# Patient Record
Sex: Male | Born: 1975 | Race: Black or African American | Hispanic: No | Marital: Married | Smoking: Never smoker
Health system: Southern US, Community
[De-identification: ages and names within clinical notes are randomized; demographics above are authoritative.]

## PROBLEM LIST (undated history)

## (undated) DIAGNOSIS — T7840XA Allergy, unspecified, initial encounter: Secondary | ICD-10-CM

## (undated) DIAGNOSIS — C801 Malignant (primary) neoplasm, unspecified: Secondary | ICD-10-CM

## (undated) HISTORY — PX: COLON SURGERY: SHX602

---

## 2009-02-17 ENCOUNTER — Ambulatory Visit: Payer: Self-pay | Admitting: Internal Medicine

## 2011-04-06 ENCOUNTER — Ambulatory Visit: Payer: Self-pay | Admitting: Internal Medicine

## 2011-11-05 ENCOUNTER — Ambulatory Visit: Payer: Self-pay | Admitting: Family Medicine

## 2012-10-28 IMAGING — CR DG CHEST 2V
1 series · 3 of 3 positions shown · non-contrast
Comparison: none

REASON FOR EXAM: palpitations, chest discomfort
COMMENTS:

[Series 1: view not recorded · 0.17mm/px · 3 of 3 slices shown]
[im 1/3]
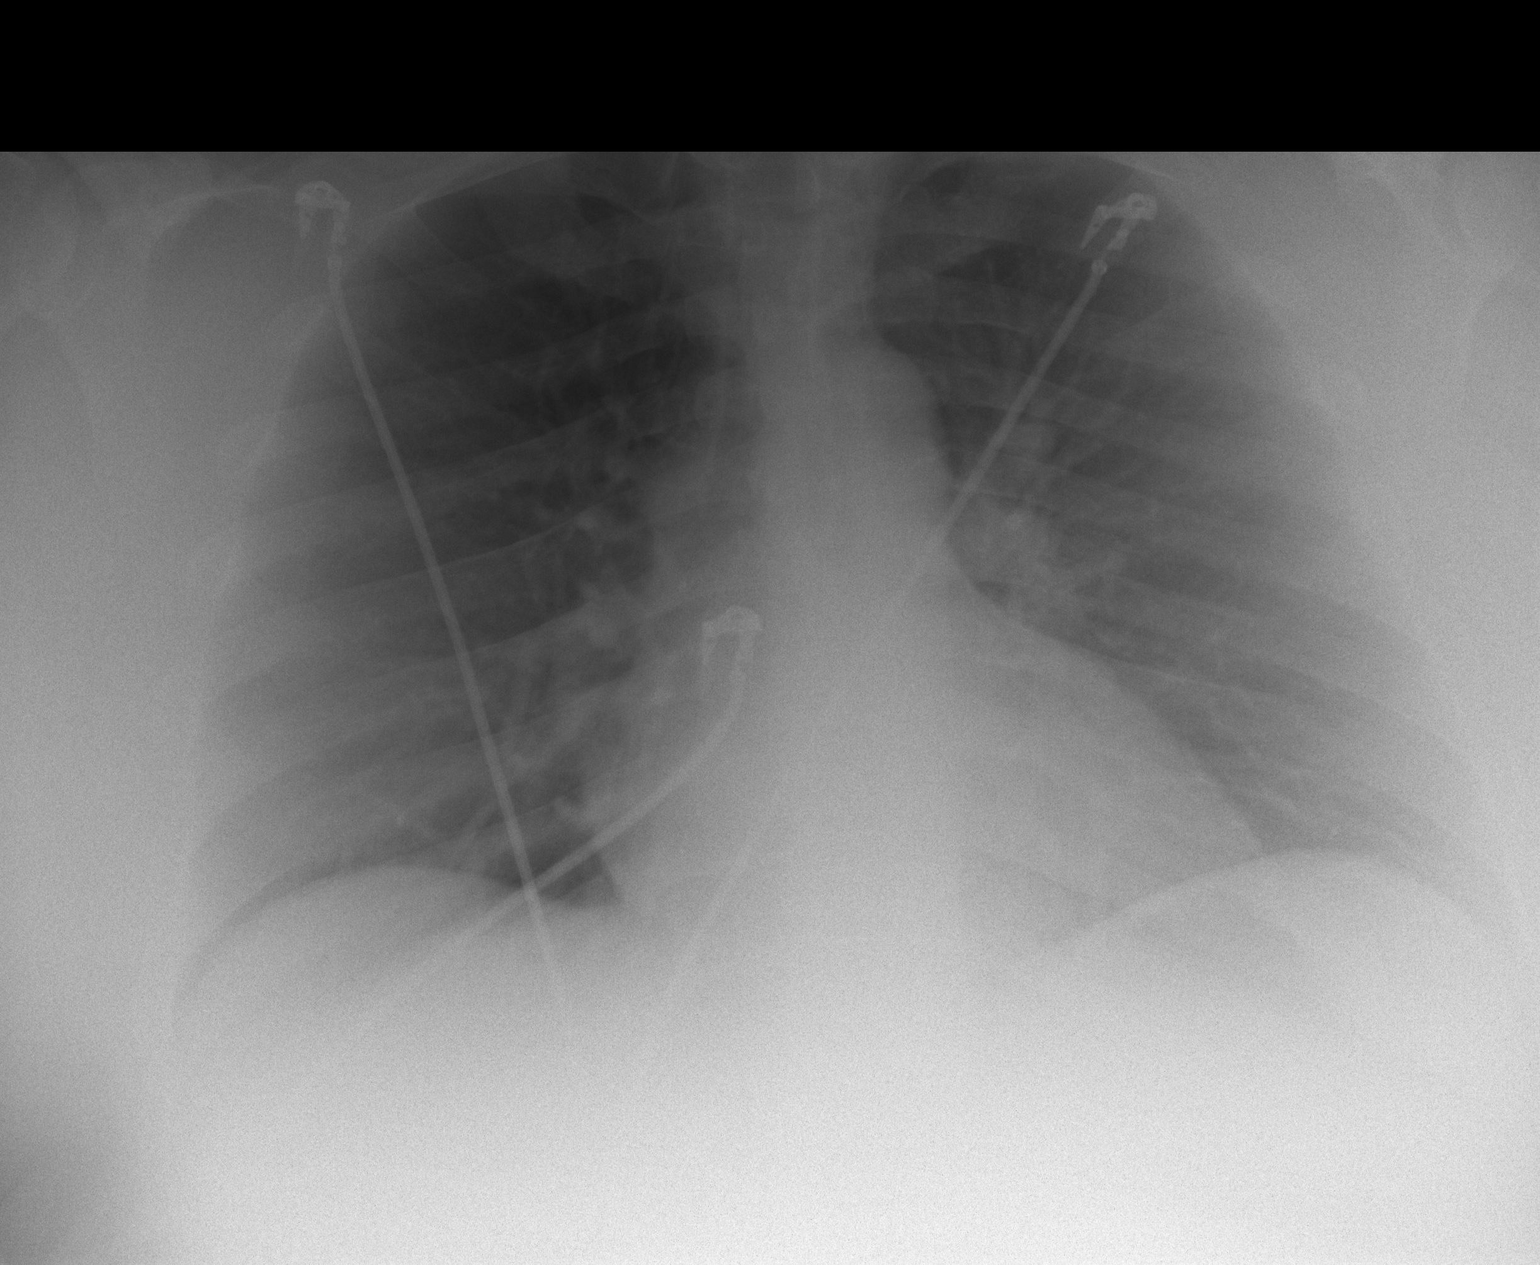
[im 2/3]
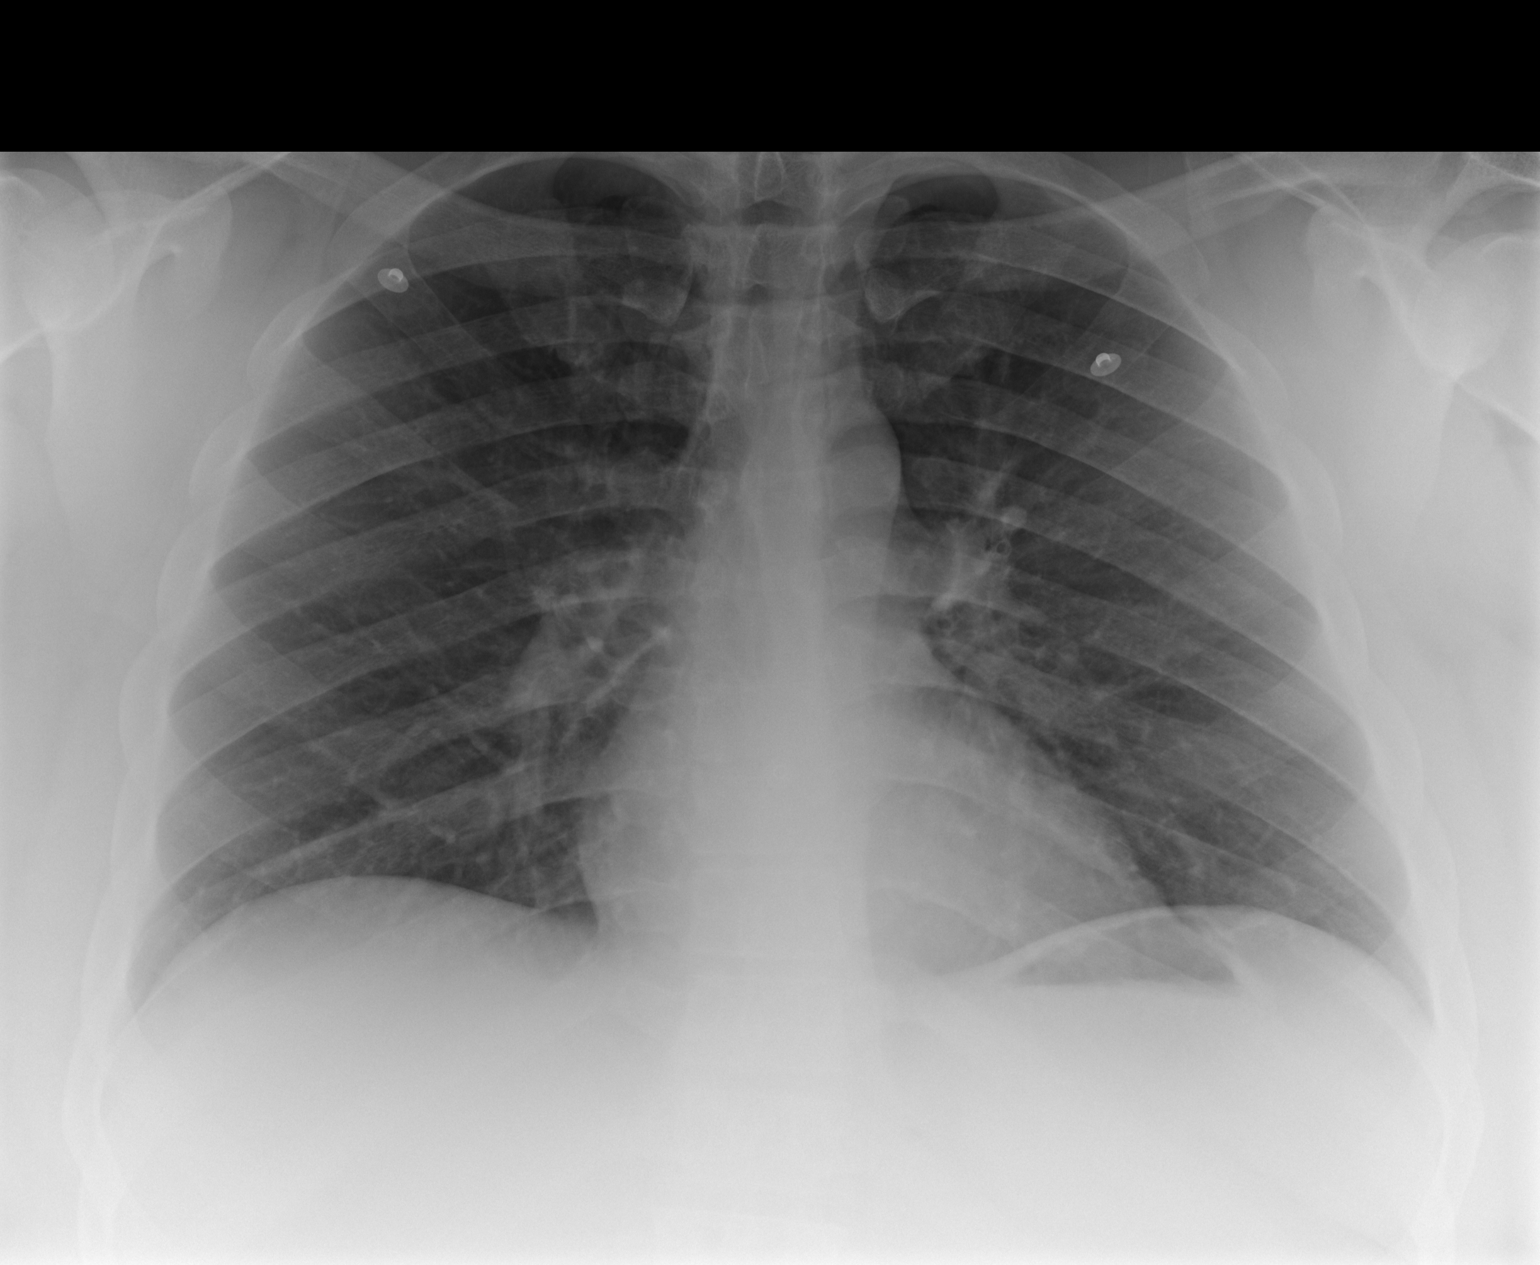
[im 3/3]
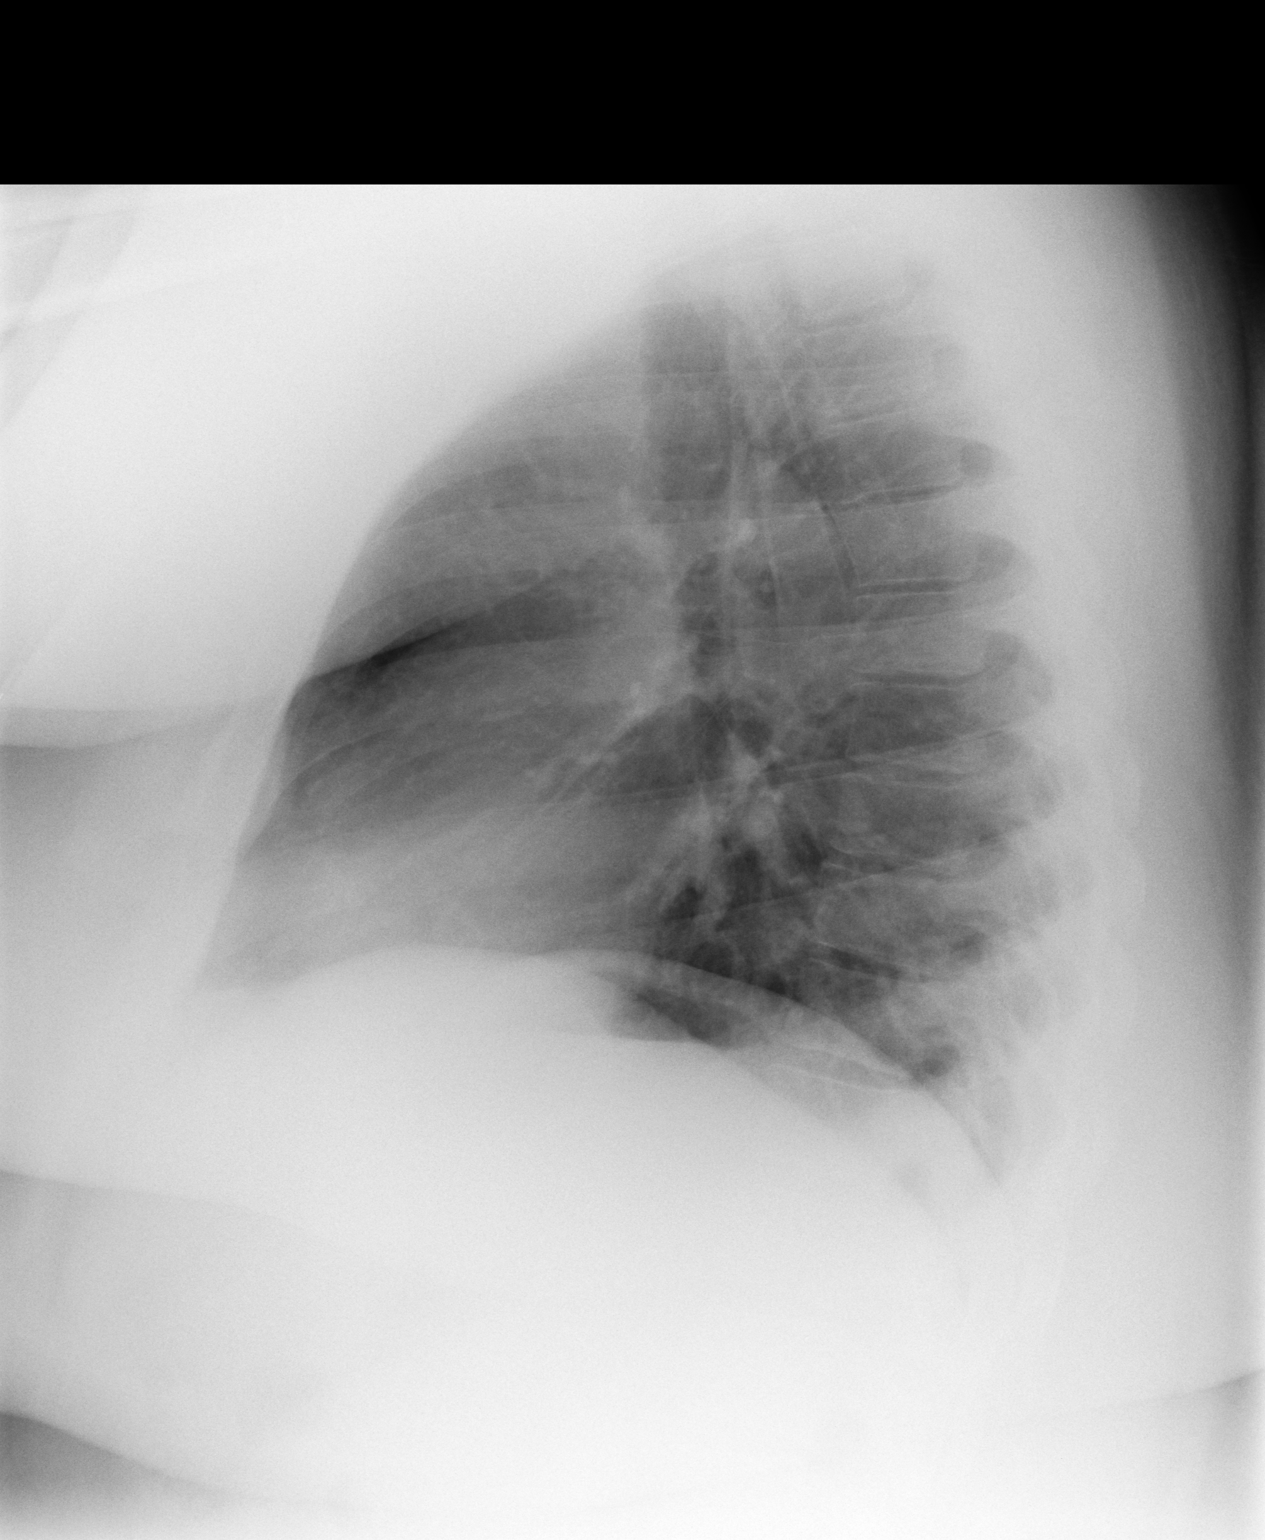

[3 of 3 positions shown; findings below may reference images not displayed]

PROCEDURE:     MDR - MDR CHEST PA(OR AP) AND LATERAL  - April 06, 2011  [DATE]

RESULT:     There is no previous exam for comparison.

Cardiac monitoring electrodes are present. The lungs are clear. The heart
and pulmonary vessels are normal. The bony and mediastinal structures are
unremarkable. There is no effusion. There is no pneumothorax or evidence of
congestive failure.
IMPRESSION: No acute cardiopulmonary disease.

## 2015-03-13 ENCOUNTER — Other Ambulatory Visit: Payer: Self-pay | Admitting: Family Medicine

## 2015-03-13 ENCOUNTER — Ambulatory Visit
Admission: RE | Admit: 2015-03-13 | Discharge: 2015-03-13 | Disposition: A | Discharge status: Home / Self Care | Payer: BC Managed Care – PPO | Source: Ambulatory Visit | Attending: Family Medicine | Admitting: Family Medicine

## 2015-03-13 DIAGNOSIS — Z85038 Personal history of other malignant neoplasm of large intestine: Secondary | ICD-10-CM | POA: Insufficient documentation

## 2015-03-13 DIAGNOSIS — C189 Malignant neoplasm of colon, unspecified: Secondary | ICD-10-CM

## 2015-03-13 DIAGNOSIS — M549 Dorsalgia, unspecified: Secondary | ICD-10-CM | POA: Diagnosis not present

## 2015-09-27 ENCOUNTER — Encounter: Payer: Self-pay | Admitting: Dietician

## 2015-09-27 ENCOUNTER — Encounter: Payer: BC Managed Care – PPO | Attending: Family Medicine | Admitting: Dietician

## 2015-09-27 NOTE — Progress Notes (Signed)
Medical Nutrition Therapy: Visit start time: 1330  end time: [id number]  Assessment:  Diagnosis: obesity Past medical history: sleep apnea Psychosocial issues/ stress concerns: patient reports high stress, used to eat when stressed, but has decreased.  Preferred learning method:  Nicki [place] . Hands-on  Current weight: 447.7lbs  Height: 6'1" Medications, supplements: reviewed list in chart with patient  Progress and evaluation: Patient reports weight loss of 3-4lbs in recent weeks.         Tried diet plan from trainer which seems to be well balanced, but he was able to follow only temporarily due to lack of variety.          He is concerned about his weight and motivated to lose weight, triggered by diagnosis of colon cancer last year.   Physical activity: gym workouts with trainer 1-1.5 hours, 2 times a week. Plans to increase to 4 times a week.  Dietary Intake:  Usual eating pattern includes 2-3 meals and 2-3 snacks per day. Dining out frequency: 7-8 meals per week (has increased recently).  Breakfast: usually cereal, Special K with berries or yogurt, sometimes with banana; or yogurt; occasionally fast food biscuit and drink. Snack: [place] snack tray with cheese, nuts, cranberries 10-10:30am Lunch: limited time, sometimes not hungry. Chicken breast, leftovers, with veg and sometimes fruit; or sandwich with [place].  Snack: usually none, occasionally snack pack as in am. Sometimes caramel rice cake, honey roasted peanuts. Typically very hungry when arriving home after work. Supper: 8-9pm. Feels full quickly. 09/26/15 pizza, breadstick, cinnamon dessert. Often take-out food recently.   When cooking, usually chicken, beef, or [place] with vegetables.  Snack: has stopped "junk" foods, sometimes [place] snack, pudding, yogurt, or orange juice. Beverages: mostly water-- 60oz or more daily; occasionally soda, some fruit juices.   Nutrition Care Education: Topics covered: weight management Basic  nutrition: basic food groups, appropriate nutrient balance, appropriate meal and snack schedule, general nutrition guidelines    Weight control: benefits of weight control, 2000kcal meal plan guide with 45%CHO, 25%protein, 30%fat; food portions; strategies for decreasing calories;   Importance of low fat and low sugar foods; allowing for occasional treats; encouraged step by step changes. Tracking food intake.   Nutritional Diagnosis:  [place]-3.3 Overweight/obesity As related to history of excess caloric intake and inactivity.  As evidenced by patient report.  Intervention: Instruction as noted above.   Set goals with patient input.   Patient feels he needs to reduce bread intake, and sweets.    He has made significant diet changes already, but voices some discouragement with seemingly slow weight loss so far.   Education Materials given:  . Food lists/ Planning A Balanced Meal . Sample meal pattern/ menus: Quick and Healthy Meal Ideas . Goals/ instructions  Learner/ who was taught:  . Patient  . Spouse/ partner  Level of understanding: Marland Kitchen Verbalizes/ demonstrates competency  Demonstrated degree of understanding via:   Teach back Learning barriers: . None  Willingness to learn/ readiness for change: . Eager, change in progress  Monitoring and Evaluation:  Dietary intake, exercise, and body weight      follow up: 10/26/15

## 2015-09-27 NOTE — Patient Instructions (Signed)
   Control portions of breads, particularly on weekends, to control carb intake.   Healthy alternatives for sweets: graham crackers and peanut butter, yogurt with fruit, fruit with nuts, 1/2 peanut butter and honey sandwich  Try tracking food intake; free phone apps /websites include MyFitnessPal, or LoseIt!Marland Kitchen They can give you good feedback with calorie intake.   Work to limit late meals, at least 2-3 hours before going to sleep.

## 2015-10-26 ENCOUNTER — Ambulatory Visit: Payer: BC Managed Care – PPO | Admitting: Dietician

## 2015-11-21 ENCOUNTER — Encounter: Payer: Self-pay | Admitting: Dietician

## 2015-11-21 NOTE — Progress Notes (Signed)
Have not heard back from patient to reschedule; sent discharge letter to MD.

## 2016-12-29 ENCOUNTER — Ambulatory Visit
Admission: EM | Admit: 2016-12-29 | Discharge: 2016-12-29 | Disposition: A | Discharge status: Home / Self Care | Payer: BC Managed Care – PPO | Attending: Family Medicine | Admitting: Family Medicine

## 2016-12-29 DIAGNOSIS — Z23 Encounter for immunization: Secondary | ICD-10-CM | POA: Diagnosis not present

## 2016-12-29 DIAGNOSIS — S61011A Laceration without foreign body of right thumb without damage to nail, initial encounter: Secondary | ICD-10-CM

## 2016-12-29 DIAGNOSIS — S61012A Laceration without foreign body of left thumb without damage to nail, initial encounter: Secondary | ICD-10-CM | POA: Diagnosis not present

## 2016-12-29 DIAGNOSIS — W260XXA Contact with knife, initial encounter: Secondary | ICD-10-CM | POA: Diagnosis not present

## 2016-12-29 DIAGNOSIS — S61209A Unspecified open wound of unspecified finger without damage to nail, initial encounter: Secondary | ICD-10-CM

## 2016-12-29 MED ORDER — TETANUS-DIPHTHERIA TOXOIDS TD 5-2 LFU IM INJ
0.5000 mL | INJECTION | Freq: Once | INTRAMUSCULAR | Status: AC
  Administered 2016-12-29: 0.5 mL via INTRAMUSCULAR

## 2016-12-29 NOTE — ED Triage Notes (Signed)
Patient stated he was cutting Zucchini with kitchen knife and knife slipped and cut tip right thumb about 20 minutes ago.

## 2016-12-29 NOTE — ED Provider Notes (Signed)
[facility] ____________________________________________  Time seen: Approximately 12:22 PM  I have reviewed the triage vital signs and the nursing notes.   HISTORY  Chief Complaint Extremity Laceration and Finger Injury   HPI Brian Lucas is a 41 y.o. male  presenting for evaluation of laceration to right thumb that occurred just prior to arrival. Patient reports that he was at home using a mandolin slicer to cut zucchini and accidentally cut the distal edge of his right thumb. States minimal pain to laceration site at this time, denies any other pain or injury. Denies paresthesias, decreased range of motion or pain radiation. Reports right-handed. Unsure of last tetanus immunization. Denies fall to the ground or other complaints. No alleviating measures taken prior to arrival. Denies aggravating factors. States not diabetic.Denies recent sickness.    History reviewed. No pertinent past medical history. Denies There are no active problems to display for this patient.   Past Surgical History:  Procedure Laterality Date  . COLON SURGERY       No current facility-administered medications for this encounter.   Current Outpatient Prescriptions:  .  albuterol (PROAIR HFA) 108 (90 Base) MCG/ACT inhaler, Inhale into the lungs., Disp: , Rfl:  .  EPINEPHrine (EPIPEN 2-PAK) 0.3 mg/0.3 mL IJ SOAJ injection, Inject into the muscle., Disp: , Rfl:  .  fexofenadine (ALLEGRA) 30 MG tablet, Take 30 mg by mouth 2 (two) times daily., Disp: , Rfl:   Allergies Iodinated diagnostic agents; Other; and Shellfish allergy  No family history on file.  Social History Social History  Substance Use Topics  . Smoking status: Never Smoker  . Smokeless tobacco: Never Used  . Alcohol use No    Review of Systems Constitutional: No fever/chills Cardiovascular: Denies chest pain. Respiratory: Denies shortness of breath. Musculoskeletal: Negative for back pain. Skin: As  above ____________________________________________   PHYSICAL EXAM:  VITAL SIGNS: ED Triage Vitals  Enc Vitals Group     BP 12/29/16 1108 99/64     Pulse Rate 12/29/16 1108 77     Resp -- 18     Temp 12/29/16 1108 98.4 F (36.9 C)     Temp Source 12/29/16 1108 Oral     SpO2 12/29/16 1108 98 %     Weight 12/29/16 1112 (!) 424 lb (192.3 kg)     Height 12/29/16 1112 6' (1.829 m)     Head Circumference --      Peak Flow --      Pain Score 12/29/16 1113 4     Pain Loc --      Pain Edu? --      Excl. in [place]? --     Constitutional: Alert and oriented. Well appearing and in no acute distress. Cardiovascular: Normal rate, regular rhythm. Grossly normal heart sounds.  Good peripheral circulation. Respiratory: Normal respiratory effort without tachypnea nor retractions. Breath sounds are clear and equal bilaterally. No wheezes, rales, rhonchi.  Musculoskeletal: Steady gait.  Neurologic:  Normal speech and language. No gross focal neurologic deficits are appreciated. Speech is normal. No gait instability.  Skin:  Skin is warm, dry except: Right distal thumb radial aspect at the distal lateral tip superficial skin avulsion with mild active bleeding, no foreign body visualized, new bone or tendon visualized, no bony tenderness, minimal tenderness at laceration site, full range of motion present, good resisted flexion-extension, right hand otherwise nontender. Psychiatric: Mood and affect are normal. Speech and behavior are normal. Patient exhibits appropriate insight and judgment   ___________________________________________  LABS (all labs ordered are listed, but only abnormal results are displayed)  Labs Reviewed - No data to display ____________________________________________   PROCEDURES Procedures   INITIAL IMPRESSION / ASSESSMENT AND PLAN / ED COURSE  Pertinent labs & imaging results that were available during my care of the patient were reviewed by me and considered in my  medical decision making (see chart for details).  Well-appearing patient. No acute distress. Distal fingertip skin avulsion. Tetanus immunization updated. No repair indicated. The wound was copiously cleaned and irrigated by RN with Betadine and saline. Topical antibiotic and dressing applied. Discussed wound care, OTC topical antibiotics and follow-up as needed.    Discussed follow up and return parameters including no resolution or any worsening concerns. Patient verbalized understanding and agreed to plan.   ____________________________________________   FINAL CLINICAL IMPRESSION(S) / ED DIAGNOSES  Final diagnoses:  Avulsion of finger tip, initial encounter     New Prescriptions   No medications on file    Note: This dictation was prepared with Dragon dictation along with smaller phrase technology. Any transcriptional errors that result from this process are unintentional.         Marylene Land, NP 12/29/16 1228

## 2016-12-29 NOTE — Discharge Instructions (Signed)
Keep clean as discussed.   Follow up with your primary care physician this week as needed. Return to Urgent care for new or worsening concerns.

## 2017-07-31 ENCOUNTER — Ambulatory Visit
Admission: EM | Admit: 2017-07-31 | Discharge: 2017-07-31 | Discharge status: Absent without leave | Payer: BC Managed Care – PPO

## 2017-08-01 ENCOUNTER — Other Ambulatory Visit: Payer: Self-pay

## 2017-08-01 ENCOUNTER — Ambulatory Visit (INDEPENDENT_AMBULATORY_CARE_PROVIDER_SITE_OTHER): Payer: BC Managed Care – PPO

## 2017-08-01 ENCOUNTER — Ambulatory Visit
Admission: EM | Admit: 2017-08-01 | Discharge: 2017-08-01 | Disposition: A | Discharge status: Home / Self Care | Payer: BC Managed Care – PPO | Attending: Family Medicine | Admitting: Family Medicine

## 2017-08-01 ENCOUNTER — Encounter: Payer: Self-pay | Admitting: Gynecology

## 2017-08-01 DIAGNOSIS — M79675 Pain in left toe(s): Secondary | ICD-10-CM

## 2017-08-01 DIAGNOSIS — S92502A Displaced unspecified fracture of left lesser toe(s), initial encounter for closed fracture: Secondary | ICD-10-CM

## 2017-08-01 DIAGNOSIS — W228XXA Striking against or struck by other objects, initial encounter: Secondary | ICD-10-CM | POA: Diagnosis not present

## 2017-08-01 HISTORY — DX: Allergy, unspecified, initial encounter: T78.40XA

## 2017-08-01 HISTORY — DX: Morbid (severe) obesity due to excess calories: E66.01

## 2017-08-01 NOTE — ED Triage Notes (Signed)
Per patient stomp his left pointer toe x yesterday. Per patient his toe is bruise and painful.

## 2017-08-01 NOTE — ED Provider Notes (Signed)
[facility]    CSN: [id number] Arrival date & time: 08/01/17  1011     History   Chief Complaint Chief Complaint  Patient presents with  . Toe Pain    HPI Brian Lucas is a 42 y.o. male.   42 yo male with a c/o pain to second toe on left foot after stubbing his toe yesterday at home. Today toe is swollen, tender and bruised.   The history is provided by the patient.  Toe Pain     Past Medical History:  Diagnosis Date  . Allergy   . Morbid obesity ([place])     There are no active problems to display for this patient.   Past Surgical History:  Procedure Laterality Date  . COLON SURGERY         Home Medications    Prior to Admission medications   Medication Sig Start Date End Date Taking? Authorizing Provider  albuterol (PROAIR HFA) 108 (90 Base) MCG/ACT inhaler Inhale into the lungs. 06/22/15 08/01/17 Yes [provider]  cetirizine (ZYRTEC) 10 MG tablet Take 10 mg by mouth daily.   Yes [provider]  EPINEPHrine (EPIPEN 2-PAK) 0.3 mg/0.3 mL IJ SOAJ injection Inject into the muscle.   Yes [provider]  fexofenadine (ALLEGRA) 30 MG tablet Take 30 mg by mouth 2 (two) times daily.    [provider]    Family History Family History  Problem Relation Age of Onset  . Pulmonary fibrosis Mother     Social History Social History   Tobacco Use  . Smoking status: Never Smoker  . Smokeless tobacco: Never Used  Substance Use Topics  . Alcohol use: No    Alcohol/week: 0.0 oz  . Drug use: No     Allergies   Iodinated diagnostic agents; Other; and Shellfish allergy   Review of Systems Review of Systems   Physical Exam Triage Vital Signs ED Triage Vitals  Enc Vitals Group     BP 08/01/17 1034 (!) 141/86     Pulse Rate 08/01/17 1034 77     Resp 08/01/17 1034 18     Temp 08/01/17 1034 98.2 F (36.8 C)     Temp Source 08/01/17 1034 Oral     SpO2 08/01/17 1034 98 %     Weight 08/01/17 1031 (!) 420  lb (190.5 kg)     Height 08/01/17 1031 6' (1.829 m)     Head Circumference --      Peak Flow --      Pain Score 08/01/17 1030 5     Pain Loc --      Pain Edu? --      Excl. in [place]? --    No data found.  Updated Vital Signs BP (!) 141/86 (BP Location: Left Arm)   Pulse 77   Temp 98.2 F (36.8 C) (Oral)   Resp 18   Ht 6' (1.829 m)   Wt (!) 420 lb (190.5 kg)   SpO2 98%   BMI 56.96 kg/m   Visual Acuity Right Eye Distance:   Left Eye Distance:   Bilateral Distance:    Right Eye Near:   Left Eye Near:    Bilateral Near:     Physical Exam  Constitutional: He appears well-developed and well-nourished. No distress.  Musculoskeletal:       Left foot: There is bony tenderness (over second toe) and swelling. There is normal range of motion, normal capillary refill, no crepitus, no deformity and  no laceration.  Skin: He is not diaphoretic.  Nursing note and vitals reviewed.    UC Treatments / Results  Labs (all labs ordered are listed, but only abnormal results are displayed) Labs Reviewed - No data to display  EKG  EKG Interpretation None       Radiology Dg Toe 2nd Left  Result Date: 08/01/2017 CLINICAL DATA:  Acute left second toe injury and pain last night. Initial encounter. EXAM: LEFT SECOND TOE COMPARISON:  None. FINDINGS: There is an equivocal nondisplaced fracture at the base of the second toe proximal phalanx-correlate with pain. No other fracture, subluxation or dislocation identified. The Lisfranc joints are unremarkable. IMPRESSION: Equivocal nondisplaced fracture at the base of the second toe proximal phalanx-correlate with pain. Electronically Signed   By: Margarette Canada M.D.   On: 08/01/2017 11:42    Procedures Procedures (including critical care time)  Medications Ordered in UC Medications - No data to display   Initial Impression / Assessment and Plan / UC Course  I have reviewed the triage vital signs and the nursing notes.  Pertinent labs &  imaging results that were available during my care of the patient were reviewed by me and considered in my medical decision making (see chart for details).       Final Clinical Impressions(s) / UC Diagnoses   Final diagnoses:  Closed fracture of phalanx of left second toe, initial encounter    ED Discharge Orders    None     1. x-ray results and diagnosis reviewed with patient 2. Recommend supportive treatment with buddy tape, hard sole shoe, otc analgesics prn 3. Follow-up prn if symptoms worsen or don't improve   Controlled Substance Prescriptions [place] Controlled Substance Registry consulted? Not Applicable   Norval Gable, MD 08/01/17 [phone]

## 2017-08-03 ENCOUNTER — Telehealth: Payer: Self-pay

## 2017-08-03 NOTE — Telephone Encounter (Signed)
Called to follow up with patient since visit here at [facility]. Patient instructed to call back with any questions or concerns. MAH  

## 2019-01-08 ENCOUNTER — Ambulatory Visit
Admission: EM | Admit: 2019-01-08 | Discharge: 2019-01-08 | Disposition: A | Discharge status: Home / Self Care | Payer: 59

## 2019-01-08 ENCOUNTER — Ambulatory Visit: Payer: 59

## 2019-01-08 ENCOUNTER — Ambulatory Visit (INDEPENDENT_AMBULATORY_CARE_PROVIDER_SITE_OTHER): Payer: 59

## 2019-01-08 ENCOUNTER — Other Ambulatory Visit: Payer: Self-pay

## 2019-01-08 DIAGNOSIS — M25532 Pain in left wrist: Secondary | ICD-10-CM | POA: Diagnosis not present

## 2019-01-08 DIAGNOSIS — Y93B3 Activity, free weights: Secondary | ICD-10-CM | POA: Diagnosis not present

## 2019-01-08 DIAGNOSIS — S66912A Strain of unspecified muscle, fascia and tendon at wrist and hand level, left hand, initial encounter: Secondary | ICD-10-CM | POA: Diagnosis not present

## 2019-01-08 MED ORDER — NAPROXEN 500 MG PO TABS: 500.0000 mg | ORAL_TABLET | Freq: Two times a day (BID) | ORAL | 0 refills | Status: AC | PRN

## 2019-01-08 NOTE — ED Triage Notes (Signed)
Pt states he started working out 3 weeks ago with weights and injured his left wrist. Left lateral wrist pain 3/10. Worked out with trainer this morning and it was bothering him. Pain to lateral area

## 2019-01-08 NOTE — Discharge Instructions (Addendum)
It was very nice seeing you today in clinic. Thank you for entrusting me with your care.   Please utilize the medications that we discussed. Your prescriptions have been called in to your pharmacy.   Make arrangements to follow up with orthopedics in 1 week for re-evaluation if not improving. I have provided you the name and office contact information for an excellent local provider. If your symptoms/condition worsens, please seek follow up care either here or in the ER. Please remember, our [place] providers are "right here with you" when you need [place].   Again, it was my pleasure to take care of you today. Thank you for choosing our clinic. I hope that you start to feel better quickly.   Honor Loh, MSN, APRN, FNP-C, CEN Advanced Practice Provider [facility]

## 2019-01-08 NOTE — ED Provider Notes (Signed)
[place], [place]   Name: Brian Lucas DOB: 1976-05-02 MRN: [id number] CSN: [id number] PCP: Hortencia Pilar, MD  Arrival date and time:  01/08/19 1121  Chief Complaint:  Wrist Pain   NOTE: Prior to seeing the patient today, I have reviewed the triage nursing documentation and vital signs. Clinical staff has updated patient's PMH/PSHx, current medication list, and drug allergies/intolerances to ensure comprehensive history available to assist in medical decision making.   History:   HPI: Brian Lucas is a 43 y.o. male who presents today with complaints of LEFT wrist pain that began while working out (lifting weights) x 2-3 weeks ago. Pain has been relaxing and remitting in nature. Pain overlying side of hand; ulnar distribution. He notes intermittent paraesthesias. Pain exacerbated by flexion and extension, as well as both radial and ulnar deviation of the wrist. Patient denies crepitus and hand weakness. Patient denies previous injury to wrist/hand; no previous surgeries.   Despite his symptoms, patient has not taken any over the counter interventions to help improve/relieve his reported symptoms at home. He has used ice on a few occassions, which has not improved his symptoms. Patient notes that when working with his trainer this morning, the pain became intense. Trainer recommended that patient come in for diagnostic plain films.   Past Medical History:  Diagnosis Date  . Allergy   . Morbid obesity ([place])     Past Surgical History:  Procedure Laterality Date  . COLON SURGERY      Family History  Problem Relation Age of Onset  . Pulmonary fibrosis Mother     Social History   Tobacco Use  . Smoking status: Never Smoker  . Smokeless tobacco: Never Used  Substance Use Topics  . Alcohol use: No    Alcohol/week: 0.0 standard drinks  . Drug use: No    There are no active problems to display for this patient.   Home Medications:    Current Meds  Medication Sig  .  enoxaparin (LOVENOX) 120 MG/0.8ML injection Inject into the skin.    Allergies:   Iodinated diagnostic agents, Other, and Shellfish allergy  Review of Systems (ROS): Review of Systems  Constitutional: Negative for chills and fever.  Respiratory: Negative for cough and shortness of breath.   Cardiovascular: Negative for chest pain and palpitations.  Musculoskeletal:       Acute LEFT wrist pain  Neurological: Positive for numbness. Negative for weakness.     Vital Signs: Today's Vitals   01/08/19 1129 01/08/19 1130 01/08/19 1211  BP: 135/90    Pulse: (!) 108    Resp: 20    Temp: 98.2 F (36.8 C)    TempSrc: Oral    SpO2: 100%    Weight:  (!) 440 lb (199.6 kg)   Height:  6' (1.829 m)   PainSc:  3  3     Physical Exam: Physical Exam  Constitutional: He is oriented to person, place, and time and well-developed, well-nourished, and in no distress.  HENT:  Head: Normocephalic and atraumatic.  Mouth/Throat: Mucous membranes are normal.  Cardiovascular: Regular rhythm, normal heart sounds and intact distal pulses. Tachycardia present. Exam reveals no gallop and no friction rub.  No murmur heard. Pulmonary/Chest: Effort normal and breath sounds normal. No respiratory distress. He has no wheezes. He has no rales.  Musculoskeletal:     Left wrist: He exhibits tenderness and bony tenderness. He exhibits no swelling, no effusion, no crepitus and no deformity.  Neurological: He is alert and  oriented to person, place, and time. Gait normal. GCS score is 15.  Skin: Skin is warm and dry. No rash noted.  Psychiatric: Mood, memory, affect and judgment normal.  Nursing note and vitals reviewed.   Urgent Care Treatments / Results:   LABS: PLEASE NOTE: all labs that were ordered this encounter are listed, however only abnormal results are displayed. Labs Reviewed - No data to display  EKG: -None  RADIOLOGY: Dg Wrist Complete Left  Result Date: 01/08/2019 CLINICAL DATA:  Pain  lateral side of LEFT wrist after injury. EXAM: LEFT WRIST - COMPLETE 3+ VIEW COMPARISON:  None. FINDINGS: There is no evidence of fracture or dislocation. There is no evidence of arthropathy or other focal bone abnormality. Soft tissues are unremarkable. IMPRESSION: Negative. Electronically Signed   By: Franki Cabot M.D.   On: 01/08/2019 11:56    PROCEDURES: Procedures  MEDICATIONS RECEIVED THIS VISIT: Medications - No data to display  PERTINENT CLINICAL COURSE NOTES/UPDATES:   Initial Impression / Assessment and Plan / Urgent Care Course:  Pertinent labs & imaging results that were available during my care of the patient were personally reviewed by me and considered in my medical decision making (see lab/imaging section of note for values and interpretations).  Brian Lucas is a 43 y.o. male who presents to [facility] today with complaints of Wrist Pain   Patient is well appearing overall in clinic today. He does not appear to be in any acute distress. Presenting symptoms (see HPI) and exam as documented above. Diagnostic plain films of the LEFT wrist reveal no osseous abnormality; no fracture, dislocation, or effusion. Suspect muscle strain of the wrist with possible early tendinopathy. Will treat with anti-inflammatory (naprosyn) and immobilization. Patient placed in Velcro wrist splint. Patient encouraged to rest and ice wrist. If not improving, patient will need to follow up with orthopedics for further evaluation.    In the event that patient does indeed need to be seen for further evaluation by orthopedics, I have provided him with the ame and office contact information provided on today's AVS for Dr. Hessie Knows. Patient advised the he will need to contact the office to schedule an appointment to be seen.   I have reviewed the follow up and strict return precautions for any new or worsening symptoms. Patient is aware of symptoms that would be deemed urgent/emergent, and would  thus require further evaluation either here or in the emergency department. At the time of discharge, he verbalized understanding and consent with the discharge plan as it was reviewed with him. All questions were fielded by provider and/or clinic staff prior to patient discharge.    Final Clinical Impressions / Urgent Care Diagnoses:   Final diagnoses:  Muscle strain of left wrist, initial encounter    New Prescriptions:   Controlled Substance Registry consulted? Not Applicable  Meds ordered this encounter  Medications  . naproxen (NAPROSYN) 500 MG tablet    Sig: Take 1 tablet (500 mg total) by mouth 2 (two) times daily as needed.    Dispense:  20 tablet    Refill:  0    Recommended Follow up Care:  Patient encouraged to follow up with the following provider within the specified time frame, or sooner as dictated by the severity of his symptoms. As always, he was instructed that for any urgent/emergent care needs, he should seek care either here or in the emergency department for more immediate evaluation.  Follow-up Information    Hessie Knows,  MD In 1 week.   Specialty: Orthopedic Surgery Contact information: [place] [id number] [phone]         NOTE: This note was prepared using Dragon dictation software along with smaller phrase technology. Despite my best ability to proofread, there is the potential that transcriptional errors may still occur from this process, and are completely unintentional.     Karen Kitchens, NP 01/08/19 1216

## 2019-08-10 ENCOUNTER — Ambulatory Visit
Admission: EM | Admit: 2019-08-10 | Discharge: 2019-08-10 | Disposition: A | Discharge status: Home / Self Care | Payer: Managed Care, Other (non HMO) | Attending: Family Medicine | Admitting: Family Medicine

## 2019-08-10 DIAGNOSIS — M5432 Sciatica, left side: Secondary | ICD-10-CM

## 2019-08-10 DIAGNOSIS — S39012A Strain of muscle, fascia and tendon of lower back, initial encounter: Secondary | ICD-10-CM

## 2019-08-10 MED ORDER — CYCLOBENZAPRINE HCL 10 MG PO TABS: 10.0000 mg | ORAL_TABLET | Freq: Three times a day (TID) | ORAL | 0 refills | Status: AC | PRN

## 2019-08-10 MED ORDER — HYDROCODONE-ACETAMINOPHEN 5-325 MG PO TABS: ORAL_TABLET | ORAL | 0 refills | Status: AC

## 2019-08-10 MED ORDER — PREDNISONE 10 MG PO TABS: ORAL_TABLET | ORAL | 0 refills | Status: AC

## 2019-08-10 NOTE — ED Triage Notes (Addendum)
Pt presents with c/o LBP that is mostly on the left side and is radiating into his hip and the back of his leg. This started Saturday. He states the pain is increasing. He did try SalonPas gel and patches - the patch left a small wound on his back. Pt did try one of his wife's Skelaxin with no relief.

## 2019-08-10 NOTE — ED Provider Notes (Signed)
[facility]    CSN: [id number] Arrival date & time: 08/10/19  0954      History   Chief Complaint Chief Complaint  Patient presents with  . Back Pain    HPI Brian Lucas is a 44 y.o. male.   44 yo male with a c/o left sided low back pain radiating into the left buttock and back of his leg for the past 5 days. Denies any fall or other traumatic injury. Denies any numbness/tingling, bowel or bladder problems.    Back Pain   Past Medical History:  Diagnosis Date  . Allergy   . Morbid obesity ([place])     There are no problems to display for this patient.   Past Surgical History:  Procedure Laterality Date  . COLON SURGERY         Home Medications    Prior to Admission medications   Medication Sig Start Date End Date Taking? Authorizing Provider  cetirizine (ZYRTEC) 10 MG tablet Take 10 mg by mouth daily.   Yes [provider]  enoxaparin (LOVENOX) 120 MG/0.8ML injection Inject into the skin. 11/30/18  Yes [provider]  EPINEPHrine (EPIPEN 2-PAK) 0.3 mg/0.3 mL IJ SOAJ injection Inject into the muscle.   Yes [provider]  Multiple Vitamin (MULTI-VITAMIN) tablet Take 1 tablet by mouth daily.   Yes [provider]  naproxen (NAPROSYN) 500 MG tablet Take 1 tablet (500 mg total) by mouth 2 (two) times daily as needed. 01/08/19  Yes Karen Kitchens, NP  Omega-3 Fatty Acids (RA FISH OIL) 1000 MG CAPS Take 1 tablet by mouth daily.   Yes [provider]  cyclobenzaprine (FLEXERIL) 10 MG tablet Take 1 tablet (10 mg total) by mouth 3 (three) times daily as needed for muscle spasms. 08/10/19   Norval Gable, MD  HYDROcodone-acetaminophen (NORCO/VICODIN) 5-325 MG tablet 1-2 tabs po bid prn 08/10/19   Norval Gable, MD  predniSONE (DELTASONE) 10 MG tablet Start 60 mg po day one, then 50 mg po day two, taper by 10 mg daily until complete. 08/10/19   Norval Gable, MD  albuterol (PROAIR HFA) 108 (90 Base) MCG/ACT inhaler  Inhale into the lungs. 06/22/15 01/08/19  [provider]  fexofenadine (ALLEGRA) 30 MG tablet Take 30 mg by mouth 2 (two) times daily.  01/08/19  [provider]    Family History Family History  Problem Relation Age of Onset  . Pulmonary fibrosis Mother     Social History Social History   Tobacco Use  . Smoking status: Never Smoker  . Smokeless tobacco: Never Used  Substance Use Topics  . Alcohol use: No    Alcohol/week: 0.0 standard drinks  . Drug use: No     Allergies   Iodinated diagnostic agents, Other, and Shellfish allergy   Review of Systems Review of Systems  Musculoskeletal: Positive for back pain.     Physical Exam Triage Vital Signs ED Triage Vitals  Enc Vitals Group     BP 08/10/19 1019 (!) 141/90     Pulse Rate 08/10/19 1019 77     Resp --      Temp 08/10/19 1019 98.1 F (36.7 C)     Temp Source 08/10/19 1019 Oral     SpO2 08/10/19 1019 99 %     Weight 08/10/19 1015 (!) 477 lb (216.4 kg)     Height 08/10/19 1015 6' (1.829 m)     Head Circumference --      Peak Flow --  Pain Score 08/10/19 1015 9     Pain Loc --      Pain Edu? --      Excl. in [place]? --    No data found.  Updated Vital Signs BP (!) 141/90 (BP Location: Left Wrist)   Pulse 77   Temp 98.1 F (36.7 C) (Oral)   Ht 6' (1.829 m)   Wt (!) 216.4 kg   SpO2 99%   BMI 64.69 kg/m   Visual Acuity Right Eye Distance:   Left Eye Distance:   Bilateral Distance:    Right Eye Near:   Left Eye Near:    Bilateral Near:     Physical Exam Vitals and nursing note reviewed.  Constitutional:      General: He is not in acute distress.    Appearance: He is not toxic-appearing or diaphoretic.  Cardiovascular:     Rate and Rhythm: Normal rate.  Pulmonary:     Effort: Pulmonary effort is normal. No respiratory distress.  Musculoskeletal:     Lumbar back: Spasms (left paraspinous muslces ) and tenderness (left paraspinous muscles and buttock) present. No edema,  signs of trauma, lacerations or bony tenderness. Normal range of motion. No scoliosis.  Neurological:     Mental Status: He is alert.      UC Treatments / Results  Labs (all labs ordered are listed, but only abnormal results are displayed) Labs Reviewed - No data to display  EKG   Radiology No results found.  Procedures Procedures (including critical care time)  Medications Ordered in UC Medications - No data to display  Initial Impression / Assessment and Plan / UC Course  I have reviewed the triage vital signs and the nursing notes.  Pertinent labs & imaging results that were available during my care of the patient were reviewed by me and considered in my medical decision making (see chart for details).      Final Clinical Impressions(s) / UC Diagnoses   Final diagnoses:  Strain of lumbar region, initial encounter  Sciatica, left side     Discharge Instructions     Rest, ice/heat    ED Prescriptions    Medication Sig Dispense Auth. Provider   predniSONE (DELTASONE) 10 MG tablet Start 60 mg po day one, then 50 mg po day two, taper by 10 mg daily until complete. 21 tablet Norval Gable, MD   cyclobenzaprine (FLEXERIL) 10 MG tablet Take 1 tablet (10 mg total) by mouth 3 (three) times daily as needed for muscle spasms. 30 tablet Norval Gable, MD   HYDROcodone-acetaminophen (NORCO/VICODIN) 5-325 MG tablet 1-2 tabs po bid prn 6 tablet Norval Gable, MD      1. diagnosis reviewed with patient 2. rx as per orders above; reviewed possible side effects, interactions, risks and benefits  3. Recommend supportive treatment as above  4. Follow-up prn if symptoms worsen or don't improve   I have reviewed the PDMP during this encounter.   Norval Gable, MD 08/10/19 1710

## 2019-08-10 NOTE — Discharge Instructions (Addendum)
Rest, ice/heat 

## 2020-01-16 ENCOUNTER — Ambulatory Visit
Admission: RE | Admit: 2020-01-16 | Discharge: 2020-01-16 | Disposition: A | Discharge status: Home / Self Care | Payer: 59 | Source: Ambulatory Visit | Attending: Family Medicine | Admitting: Family Medicine

## 2020-01-16 ENCOUNTER — Other Ambulatory Visit: Payer: Self-pay

## 2020-01-16 ENCOUNTER — Telehealth: Payer: Self-pay | Admitting: Emergency Medicine

## 2020-01-16 VITALS — BP 135/102 | HR 87 | Temp 98.8°F | Resp 20

## 2020-01-16 DIAGNOSIS — H6503 Acute serous otitis media, bilateral: Secondary | ICD-10-CM | POA: Diagnosis not present

## 2020-01-16 DIAGNOSIS — J069 Acute upper respiratory infection, unspecified: Secondary | ICD-10-CM | POA: Diagnosis not present

## 2020-01-16 HISTORY — DX: Malignant (primary) neoplasm, unspecified: C80.1

## 2020-01-16 MED ORDER — AMOXICILLIN 875 MG PO TABS: 875.0000 mg | ORAL_TABLET | Freq: Two times a day (BID) | ORAL | 0 refills | Status: AC

## 2020-01-16 NOTE — ED Provider Notes (Signed)
[facility]    CSN: [id number] Arrival date & time: 01/16/20  1451      History   Chief Complaint Chief Complaint  Patient presents with  . Appointment  . Sinus Problem    HPI Brian Lucas is a 44 y.o. male.   44 yo male with a c/o sinus drainage, nasal congestion, runny nose, ear pressure/pain for the past 5 days. Denies any fevers, chills, chest pains, shortness of breath, body aches.    Sinus Problem    Past Medical History:  Diagnosis Date  . Allergy   . Cancer ([place])    colon   . Morbid obesity ([place])     There are no problems to display for this patient.   Past Surgical History:  Procedure Laterality Date  . COLON SURGERY         Home Medications    Prior to Admission medications   Medication Sig Start Date End Date Taking? Authorizing Provider  cetirizine (ZYRTEC) 10 MG tablet Take 10 mg by mouth daily.   Yes [provider]  enoxaparin (LOVENOX) 120 MG/0.8ML injection Inject into the skin. 11/30/18  Yes [provider]  EPINEPHrine (EPIPEN 2-PAK) 0.3 mg/0.3 mL IJ SOAJ injection Inject into the muscle.   Yes [provider]  Multiple Vitamin (MULTI-VITAMIN) tablet Take 1 tablet by mouth daily.   Yes [provider]  Omega-3 Fatty Acids (RA FISH OIL) 1000 MG CAPS Take 1 tablet by mouth daily.   Yes [provider]  amoxicillin (AMOXIL) 875 MG tablet Take 1 tablet (875 mg total) by mouth 2 (two) times daily. 01/16/20   Norval Gable, MD  cyclobenzaprine (FLEXERIL) 10 MG tablet Take 1 tablet (10 mg total) by mouth 3 (three) times daily as needed for muscle spasms. 08/10/19   Norval Gable, MD  HYDROcodone-acetaminophen (NORCO/VICODIN) 5-325 MG tablet 1-2 tabs po bid prn 08/10/19   Norval Gable, MD  naproxen (NAPROSYN) 500 MG tablet Take 1 tablet (500 mg total) by mouth 2 (two) times daily as needed. 01/08/19   Karen Kitchens, NP  predniSONE (DELTASONE) 10 MG tablet Start 60 mg po day one, then 50 mg  po day two, taper by 10 mg daily until complete. 08/10/19   Norval Gable, MD  albuterol (PROAIR HFA) 108 (90 Base) MCG/ACT inhaler Inhale into the lungs. 06/22/15 01/08/19  [provider]  fexofenadine (ALLEGRA) 30 MG tablet Take 30 mg by mouth 2 (two) times daily.  01/08/19  [provider]    Family History Family History  Problem Relation Age of Onset  . Pulmonary fibrosis Mother     Social History Social History   Tobacco Use  . Smoking status: Never Smoker  . Smokeless tobacco: Never Used  Vaping Use  . Vaping Use: Never used  Substance Use Topics  . Alcohol use: No    Alcohol/week: 0.0 standard drinks  . Drug use: No     Allergies   Iodinated diagnostic agents, Other, and Shellfish allergy   Review of Systems Review of Systems   Physical Exam Triage Vital Signs ED Triage Vitals  Enc Vitals Group     BP 01/16/20 1517 (!) 135/102     Pulse Rate 01/16/20 1517 87     Resp 01/16/20 1517 20     Temp 01/16/20 1517 98.8 F (37.1 C)     Temp src --      SpO2 01/16/20 1517 (!) 83 %     Weight --  Height --      Head Circumference --      Peak Flow --      Pain Score 01/16/20 1510 1     Pain Loc --      Pain Edu? --      Excl. in [place]? --    No data found.  Updated Vital Signs BP (!) 135/102 (BP Location: Left Arm)   Pulse 87   Temp 98.8 F (37.1 C)   Resp 20   SpO2 97% Comment: prev O2 documented in error  Visual Acuity Right Eye Distance:   Left Eye Distance:   Bilateral Distance:    Right Eye Near:   Left Eye Near:    Bilateral Near:     Physical Exam Vitals and nursing note reviewed.  Constitutional:      General: He is not in acute distress.    Appearance: He is not toxic-appearing or diaphoretic.  HENT:     Right Ear: A middle ear effusion is present. Tympanic membrane is erythematous and bulging.     Left Ear: A middle ear effusion is present. Tympanic membrane is erythematous and bulging.     Nose: Congestion and  rhinorrhea present.  Cardiovascular:     Rate and Rhythm: Normal rate.  Pulmonary:     Effort: Pulmonary effort is normal. No respiratory distress.     Breath sounds: Normal breath sounds. No stridor. No wheezing, rhonchi or rales.  Musculoskeletal:     Cervical back: Neck supple.  Neurological:     Mental Status: He is alert.      UC Treatments / Results  Labs (all labs ordered are listed, but only abnormal results are displayed) Labs Reviewed - No data to display  EKG   Radiology No results found.  Procedures Procedures (including critical care time)  Medications Ordered in UC Medications - No data to display  Initial Impression / Assessment and Plan / UC Course  I have reviewed the triage vital signs and the nursing notes.  Pertinent labs & imaging results that were available during my care of the patient were reviewed by me and considered in my medical decision making (see chart for details).      Final Clinical Impressions(s) / UC Diagnoses   Final diagnoses:  Bilateral acute serous otitis media, recurrence not specified  Viral URI     Discharge Instructions     Afrin nasal spray, Flonase    ED Prescriptions    Medication Sig Dispense Auth. Provider   amoxicillin (AMOXIL) 875 MG tablet Take 1 tablet (875 mg total) by mouth 2 (two) times daily. 20 tablet Norval Gable, MD      1. diagnosis reviewed with patient 2. rx as per orders above; reviewed possible side effects, interactions, risks and benefits  3. Recommend supportive treatment as above 4. Follow-up prn if symptoms worsen or don't improve   PDMP not reviewed this encounter.   Norval Gable, MD 01/16/20 [phone]

## 2020-01-16 NOTE — Telephone Encounter (Signed)
Patient called stating his prescription was not sent to [facility] in [place]. Patient expressed dissatisfaction with his visit today. He requested Patient Relations number. Number given to patient. Contacted [facility] in [place] and spoke with Pam who stated they did have the prescription but it was being rejected due to patient not having prescriptions filled their before. Called patient and left message on identified voicemail that [facility] did have his prescription available.

## 2020-01-16 NOTE — Discharge Instructions (Signed)
Afrin nasal spray, Flonase

## 2020-01-16 NOTE — ED Triage Notes (Signed)
Pt reports sinus pressure, drainage, worse with laying down x 4 days.  Reports SOB and fullness in both ears.  No change in hearing.  Denies fever.    Pt does not want COVID test at this time.  Will be tested at work tomorrow.

## 2020-08-01 IMAGING — CR LEFT WRIST - COMPLETE 3+ VIEW
4 series · 4 of 4 positions shown · non-contrast
Comparison: None.

CLINICAL DATA: Pain lateral side of LEFT wrist after injury.

EXAM:
LEFT WRIST - COMPLETE 3+ VIEW

[wrist pa]
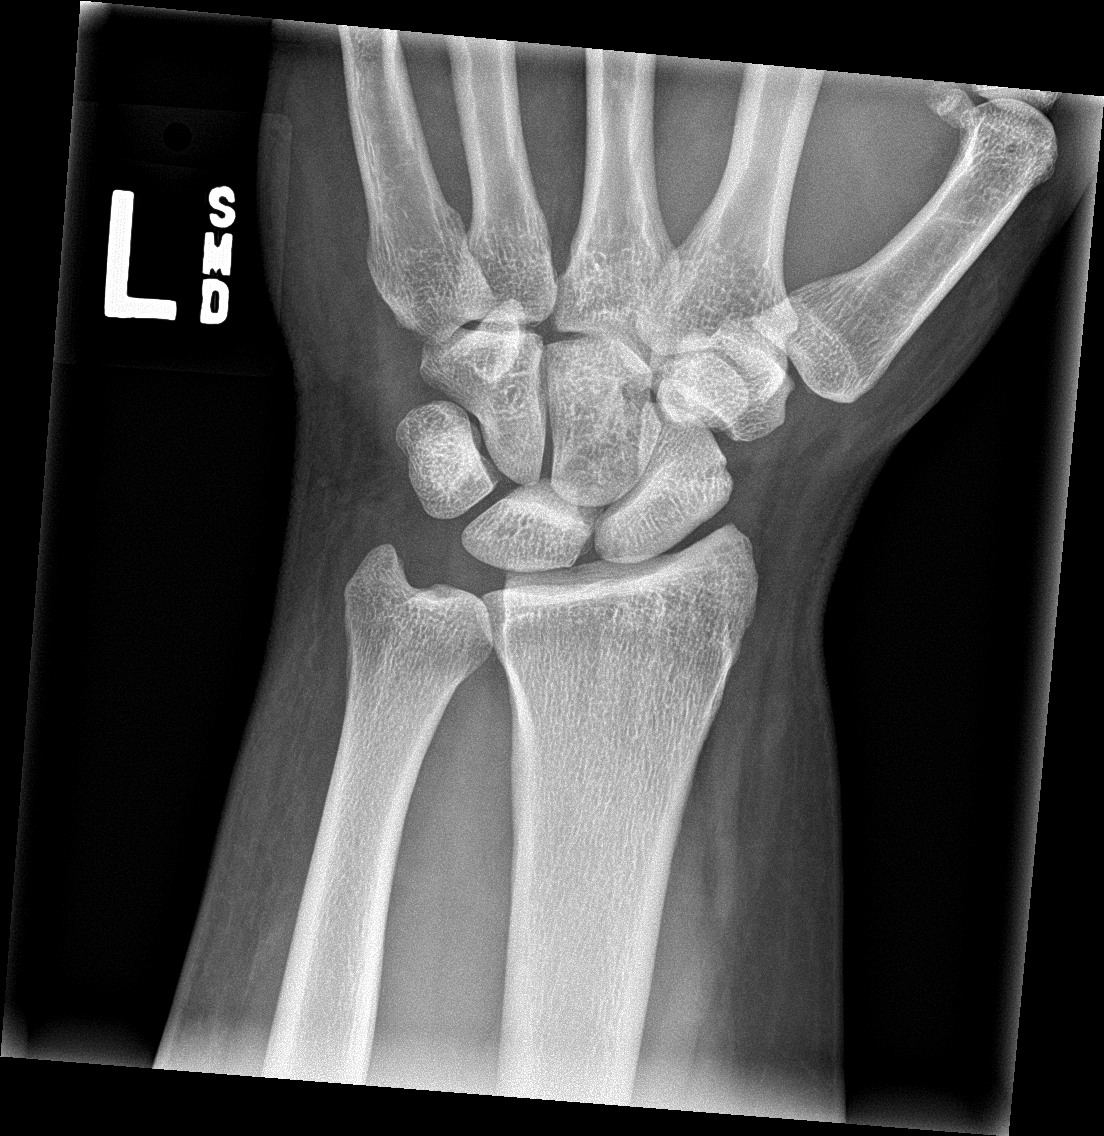

[wrist obl]
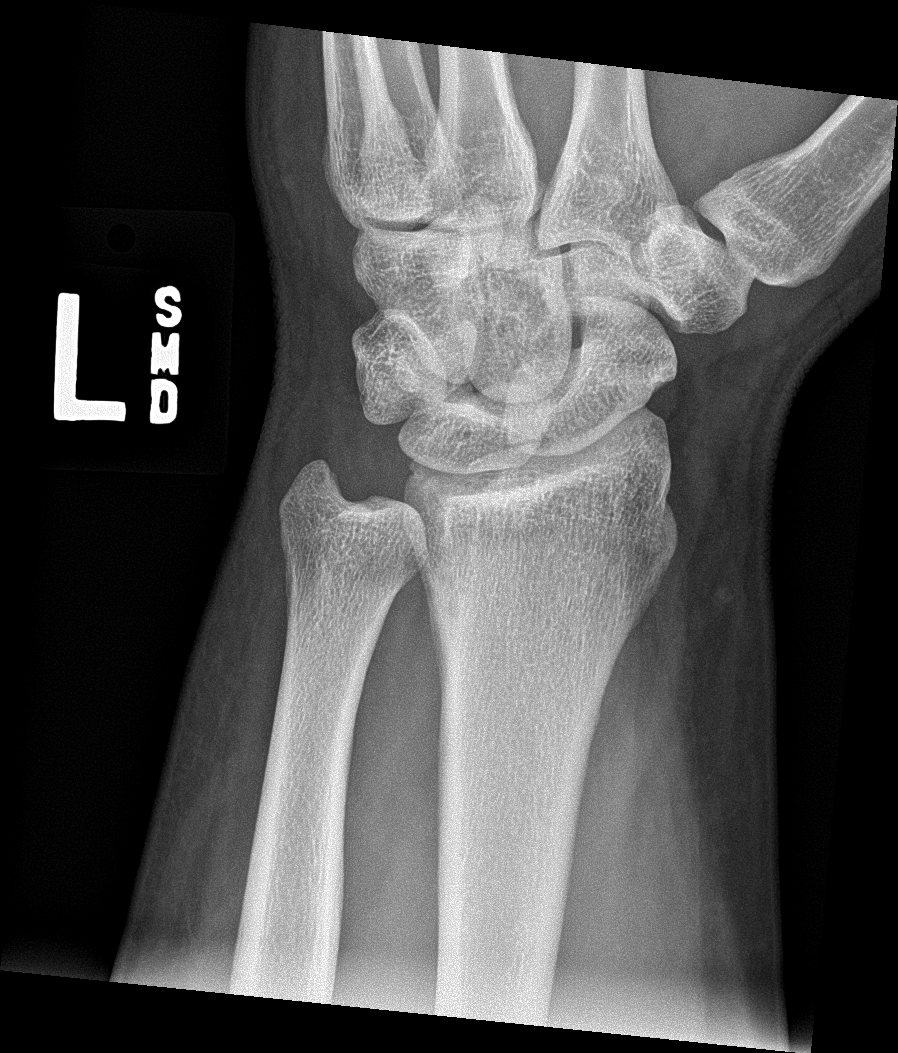

[wrist lat]
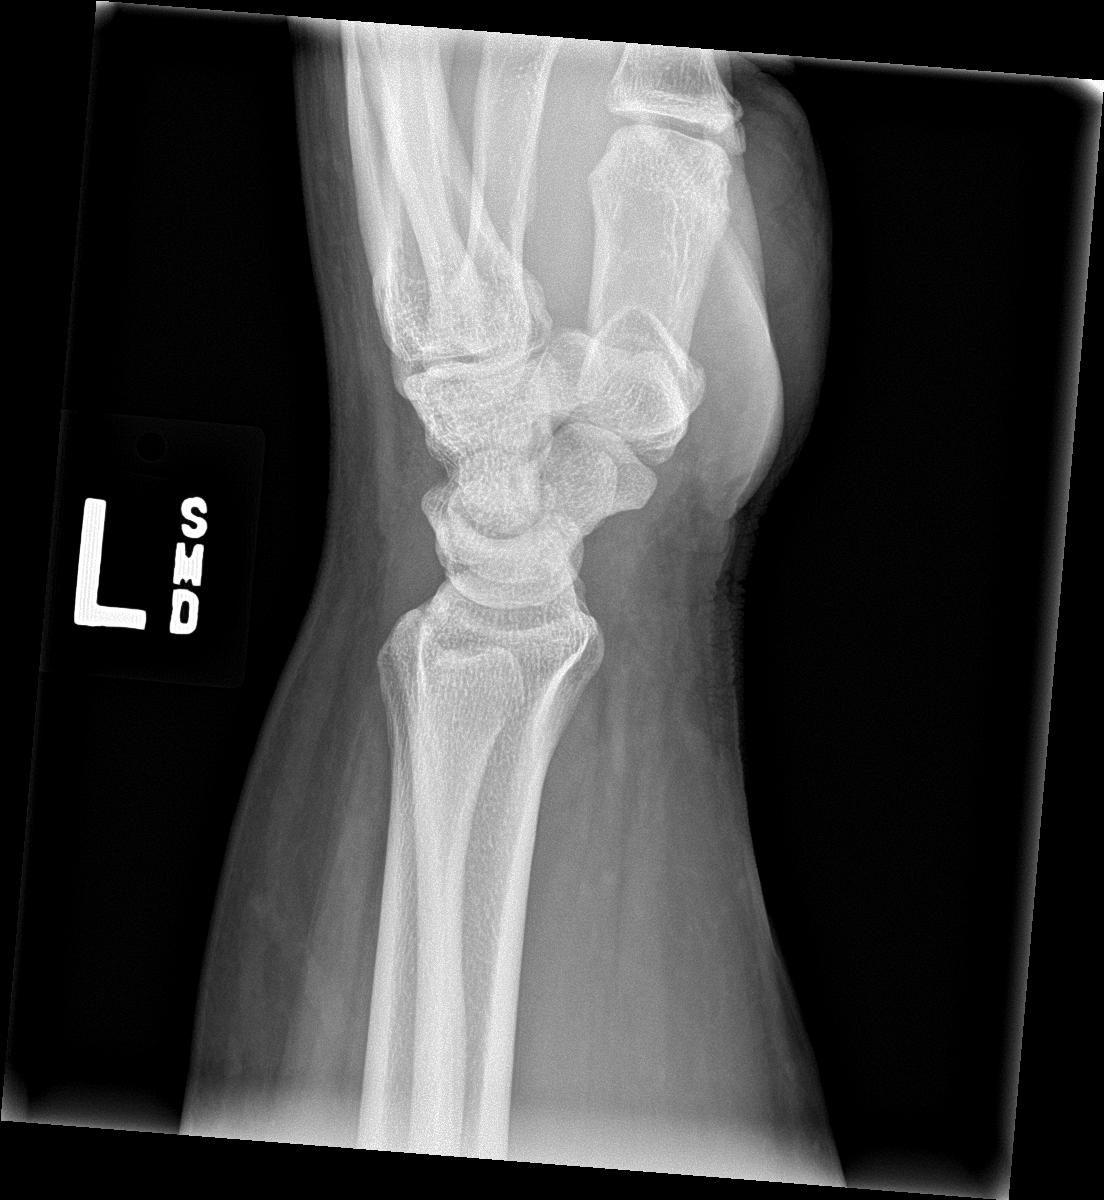

[wrist navicular]
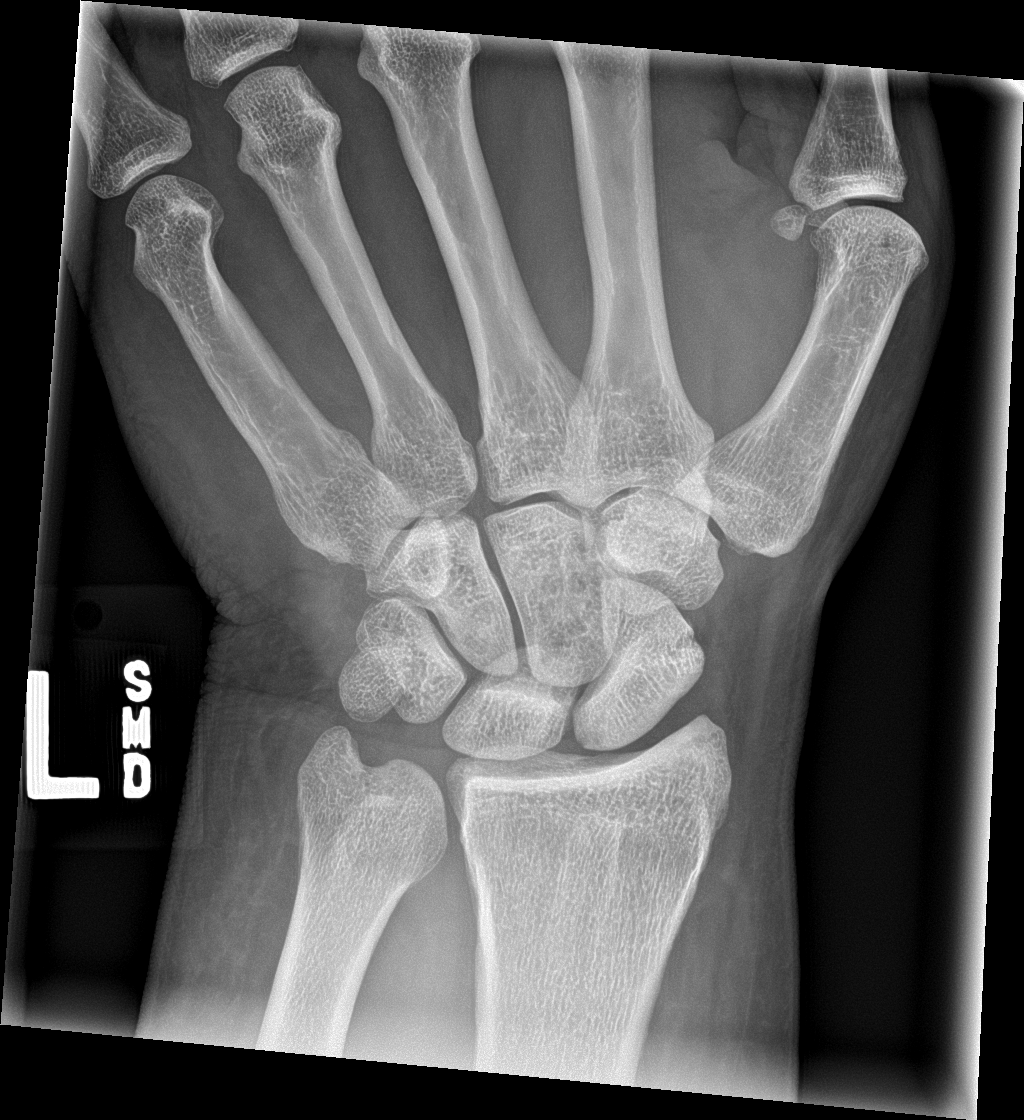

[4 of 4 positions shown; findings below may reference images not displayed]

FINDINGS: There is no evidence of fracture or dislocation. There is no
evidence of arthropathy or other focal bone abnormality. Soft
tissues are unremarkable.
IMPRESSION: Negative.

## 2021-05-03 ENCOUNTER — Emergency Department: Payer: 59

## 2021-05-03 ENCOUNTER — Other Ambulatory Visit: Payer: Self-pay

## 2021-05-03 ENCOUNTER — Emergency Department
Admission: EM | Admit: 2021-05-03 | Discharge: 2021-05-03 | Disposition: A | Discharge status: Home / Self Care | Payer: 59 | Attending: Emergency Medicine | Admitting: Emergency Medicine

## 2021-05-03 DIAGNOSIS — M79605 Pain in left leg: Secondary | ICD-10-CM | POA: Diagnosis present

## 2021-05-03 DIAGNOSIS — I82492 Acute embolism and thrombosis of other specified deep vein of left lower extremity: Secondary | ICD-10-CM | POA: Insufficient documentation

## 2021-05-03 DIAGNOSIS — Z85038 Personal history of other malignant neoplasm of large intestine: Secondary | ICD-10-CM | POA: Diagnosis not present

## 2021-05-03 DIAGNOSIS — I82442 Acute embolism and thrombosis of left tibial vein: Secondary | ICD-10-CM

## 2021-05-03 LAB — COMPREHENSIVE METABOLIC PANEL
ALT: 15 U/L (ref 0–44)
AST: 17 U/L (ref 15–41)
Albumin: 3.3 g/dL — ABNORMAL LOW (ref 3.5–5.0)
Alkaline Phosphatase: 63 U/L (ref 38–126)
Anion gap: 4 — ABNORMAL LOW (ref 5–15)
BUN: 11 mg/dL (ref 6–20)
CO2: 28 mmol/L (ref 22–32)
Calcium: 9.1 mg/dL (ref 8.9–10.3)
Chloride: 105 mmol/L (ref 98–111)
Creatinine, Ser: 1 mg/dL (ref 0.61–1.24)
GFR, Estimated: >60 mL/min (ref >60)
Glucose, Bld: 100 mg/dL — ABNORMAL HIGH (ref 70–99)
Potassium: 3.9 mmol/L (ref 3.5–5.1)
Sodium: 137 mmol/L (ref 135–145)
Total Bilirubin: 1.1 mg/dL (ref 0.3–1.2)
Total Protein: 7.6 g/dL (ref 6.5–8.1)

## 2021-05-03 LAB — CBC
HCT: 43.3 % (ref 39.0–52.0)
Hemoglobin: 14.4 g/dL (ref 13.0–17.0)
MCH: 29.7 pg (ref 26.0–34.0)
MCHC: 33.3 g/dL (ref 30.0–36.0)
MCV: 89.3 fL (ref 80.0–100.0)
Platelets: 269 10*3/uL (ref 150–400)
RBC: 4.85 MIL/uL (ref 4.22–5.81)
RDW: 13.8 % (ref 11.5–15.5)
WBC: 6.1 10*3/uL (ref 4.0–10.5)
nRBC: 0 % (ref 0.0–0.2)

## 2021-05-03 MED ORDER — ENOXAPARIN SODIUM 120 MG/0.8ML ~~LOC~~ SOLN
170.0000 mg | SUBCUTANEOUS | 0 refills | Status: DC
Start: 2021-05-03 — End: 2021-05-03

## 2021-05-03 MED ORDER — ENOXAPARIN SODIUM 300 MG/3ML IJ SOLN
1.0000 mg/kg | Freq: Once | INTRAMUSCULAR | Status: AC
  Administered 2021-05-03: 170 mg via SUBCUTANEOUS
  Filled 2021-05-03 (×2): qty 1.7

## 2021-05-03 MED ORDER — ONDANSETRON 4 MG PO TBDP
4.0000 mg | ORAL_TABLET | Freq: Once | ORAL | Status: AC
  Administered 2021-05-03: 4 mg via ORAL
  Filled 2021-05-03: qty 1

## 2021-05-03 MED ORDER — OXYCODONE-ACETAMINOPHEN 5-325 MG PO TABS
1.0000 | ORAL_TABLET | Freq: Once | ORAL | Status: AC
  Administered 2021-05-03: 1 via ORAL
  Filled 2021-05-03: qty 1

## 2021-05-03 MED ORDER — ENOXAPARIN SODIUM 120 MG/0.8ML ~~LOC~~ SOLN: 150.0000 mg | Freq: Two times a day (BID) | SUBCUTANEOUS | 2 refills | Status: AC

## 2021-05-03 MED ORDER — OXYCODONE-ACETAMINOPHEN 5-325 MG PO TABS: 1.0000 | ORAL_TABLET | Freq: Four times a day (QID) | ORAL | 0 refills | Status: AC | PRN

## 2021-05-03 NOTE — ED Provider Notes (Signed)
[facility] Emergency Department Provider Note  ____________________________________________   Event Date/Time   First MD Initiated Contact with Patient 05/03/21 1057     (approximate)  I have reviewed the triage vital signs and the nursing notes.   HISTORY  Chief Complaint Leg Pain    HPI Brian Lucas is a 45 y.o. male  here with leg pain. Pt reports approx 4 days of left leg pain. Pain is aching, throbbing, worse with movement and palpation. No cough. No alleviating factors. No recent immobilization. No rash. No skin lesions. Pt did tear his calf 6 mo ago but has had neg [place] since then. H/o DVT and PE, on Lovenox. Takes Lovenox when he travels but is no longer on it regularly. No CP, SOB.         Past Medical History:  Diagnosis Date   Allergy    Cancer ([place])    colon    Morbid obesity ([place])     There are no problems to display for this patient.   Past Surgical History:  Procedure Laterality Date   COLON SURGERY      Prior to Admission medications   Medication Sig Start Date End Date Taking? Authorizing Provider  oxyCODONE-acetaminophen (PERCOCET) 5-325 MG tablet Take 1-2 tablets by mouth every 6 (six) hours as needed for severe pain or moderate pain (no more than 6 tabs daily). 05/03/21 05/03/22 Yes Duffy Bruce, MD  amoxicillin (AMOXIL) 875 MG tablet Take 1 tablet (875 mg total) by mouth 2 (two) times daily. 01/16/20   Norval Gable, MD  cetirizine (ZYRTEC) 10 MG tablet Take 10 mg by mouth daily.    [provider]  cyclobenzaprine (FLEXERIL) 10 MG tablet Take 1 tablet (10 mg total) by mouth 3 (three) times daily as needed for muscle spasms. 08/10/19   Norval Gable, MD  enoxaparin (LOVENOX) 120 MG/0.8ML injection Inject 1.14 mLs (170 mg total) into the skin daily. 05/03/21 07/02/21  Duffy Bruce, MD  EPINEPHrine (EPIPEN 2-PAK) 0.3 mg/0.3 mL IJ SOAJ injection Inject into the muscle.    [provider]   HYDROcodone-acetaminophen (NORCO/VICODIN) 5-325 MG tablet 1-2 tabs po bid prn 08/10/19   Norval Gable, MD  Multiple Vitamin (MULTI-VITAMIN) tablet Take 1 tablet by mouth daily.    [provider]  naproxen (NAPROSYN) 500 MG tablet Take 1 tablet (500 mg total) by mouth 2 (two) times daily as needed. 01/08/19   Karen Kitchens, NP  Omega-3 Fatty Acids (RA FISH OIL) 1000 MG CAPS Take 1 tablet by mouth daily.    [provider]  predniSONE (DELTASONE) 10 MG tablet Start 60 mg po day one, then 50 mg po day two, taper by 10 mg daily until complete. 08/10/19   Norval Gable, MD  albuterol (PROAIR HFA) 108 (90 Base) MCG/ACT inhaler Inhale into the lungs. 06/22/15 01/08/19  [provider]  fexofenadine (ALLEGRA) 30 MG tablet Take 30 mg by mouth 2 (two) times daily.  01/08/19  [provider]    Allergies Iodinated diagnostic agents, Other, and Shellfish allergy  Family History  Problem Relation Age of Onset   Pulmonary fibrosis Mother     Social History Social History   Tobacco Use   Smoking status: Never   Smokeless tobacco: Never  Vaping Use   Vaping Use: Never used  Substance Use Topics   Alcohol use: No    Alcohol/week: 0.0 standard drinks   Drug use: No    Review of Systems  Review  of Systems  Constitutional:  Negative for chills and fever.  HENT:  Negative for sore throat.   Respiratory:  Negative for shortness of breath.   Cardiovascular:  Positive for leg swelling. Negative for chest pain.  Gastrointestinal:  Negative for abdominal pain.  Genitourinary:  Negative for flank pain.  Musculoskeletal:  Positive for arthralgias. Negative for neck pain.  Skin:  Negative for rash and wound.  Allergic/Immunologic: Negative for immunocompromised state.  Neurological:  Negative for weakness and numbness.  Hematological:  Does not bruise/bleed easily.  All other systems reviewed and are negative.    ____________________________________________  PHYSICAL EXAM:      VITAL SIGNS: ED Triage Vitals  Enc Vitals Group     BP 05/03/21 0918 132/81     Pulse Rate 05/03/21 0918 81     Resp 05/03/21 0918 16     Temp 05/03/21 0918 98.2 F (36.8 C)     Temp Source 05/03/21 0918 Oral     SpO2 05/03/21 0918 96 %     Weight 05/03/21 0919 (!) 375 lb (170.1 kg)     Height 05/03/21 0919 5\' 11"  (1.803 m)     Head Circumference --      Peak Flow --      Pain Score 05/03/21 0919 9     Pain Loc --      Pain Edu? --      Excl. in [place]? --      Physical Exam Vitals and nursing note reviewed.  Constitutional:      General: He is not in acute distress.    Appearance: He is well-developed.  HENT:     Head: Normocephalic and atraumatic.  Eyes:     Conjunctiva/sclera: Conjunctivae normal.  Cardiovascular:     Rate and Rhythm: Normal rate and regular rhythm.     Heart sounds: Normal heart sounds.  Pulmonary:     Effort: Pulmonary effort is normal. No respiratory distress.     Breath sounds: No wheezing.  Abdominal:     General: There is no distension.  Musculoskeletal:     Cervical back: Neck supple.  Skin:    General: Skin is warm.     Capillary Refill: Capillary refill takes less than 2 seconds.     Findings: No rash.  Neurological:     Mental Status: He is alert and oriented to person, place, and time.     Motor: No abnormal muscle tone.     LOWER EXTREMITY EXAM: LEFT  INSPECTION & PALPATION: Mild TTP over left medial gastroc/calf, no bruising, no deformity. No significant asymmetry.  SENSORY: sensation is intact to light touch in:  Superficial peroneal nerve distribution (over dorsum of foot) Deep peroneal nerve distribution (over first dorsal web space) Sural nerve distribution (over lateral aspect 5th metatarsal) Saphenous nerve distribution (over medial instep)  MOTOR:  + Motor EHL (great toe dorsiflexion) + FHL (great toe plantar flexion)  + TA (ankle dorsiflexion)   + GSC (ankle plantar flexion)  VASCULAR: 2+ dorsalis pedis and posterior tibialis pulses Capillary refill < 2 sec, toes warm and well-perfused  COMPARTMENTS: Soft, warm, well-perfused No pain with passive extension No parethesias  ____________________________________________   LABS (all labs ordered are listed, but only abnormal results are displayed)  Labs Reviewed  COMPREHENSIVE METABOLIC PANEL - Abnormal; Notable for the following components:      Result Value   Glucose, Bld 100 (*)    Albumin 3.3 (*)    Anion gap 4 (*)  All other components within normal limits  CBC    ____________________________________________  EKG:  ________________________________________  RADIOLOGY All imaging, including plain films, CT scans, and ultrasounds, independently reviewed by me, and interpretations confirmed via formal radiology reads.  ED MD interpretation:   DVT LLE: +DVT involving left posterior tibial veins  Official radiology report(s): US Venous Img Lower Unilateral Left  Result Date: 05/03/2021 CLINICAL DATA:  History of DVT.  Leg pain. EXAM: LEFT LOWER EXTREMITY VENOUS DOPPLER ULTRASOUND TECHNIQUE: Gray-scale sonography with graded compression, as well as color Doppler and duplex ultrasound were performed to evaluate the lower extremity deep venous systems from the level of the common femoral vein and including the common femoral, femoral, profunda femoral, popliteal and calf veins including the posterior tibial, peroneal and gastrocnemius veins when visible. The superficial great saphenous vein was also interrogated. Spectral Doppler was utilized to evaluate flow at rest and with distal augmentation maneuvers in the common femoral, femoral and popliteal veins. COMPARISON:  None. FINDINGS: Contralateral Common Femoral Vein: Respiratory phasicity is normal and symmetric with the symptomatic side. No evidence of thrombus. Normal compressibility. Common Femoral Vein: No evidence  of thrombus. Normal compressibility, respiratory phasicity and response to augmentation. Saphenofemoral Junction: No evidence of thrombus. Normal compressibility and flow on color Doppler imaging. Profunda Femoral Vein: No evidence of thrombus. Normal compressibility and flow on color Doppler imaging. Femoral Vein: No evidence of thrombus. Normal compressibility, respiratory phasicity and response to augmentation. Popliteal Vein: No evidence of thrombus. Normal compressibility, respiratory phasicity and response to augmentation. Calf Veins: Positive for thrombus. Posterior tibial veins are not compressible and no color Doppler flow. Limited evaluation of the left peroneal veins but there appears to be color Doppler flow and compressibility involving at least 1 peroneal vein. Other Findings:  None. IMPRESSION: Positive for deep venous thrombosis involving left posterior tibial veins. This thrombus is age-indeterminate. Electronically Signed   By: Markus Daft M.D.   On: 05/03/2021 11:17    ____________________________________________  PROCEDURES   Procedure(s) performed (including Critical Care):  Procedures  ____________________________________________  INITIAL IMPRESSION / MDM / [place] / ED COURSE  As part of my medical decision making, I reviewed the following data within the [place] notes reviewed and incorporated, Old chart reviewed, Notes from prior ED visits, and [place] Controlled Substance Database       *KIMONI PAGLIARULO was evaluated in Emergency Department on 05/03/2021 for the symptoms described in the history of present illness. He was evaluated in the context of the global COVID-19 pandemic, which necessitated consideration that the patient might be at risk for infection with the SARS-CoV-2 virus that causes COVID-19. Institutional protocols and algorithms that pertain to the evaluation of patients at risk for COVID-19 are in a state of rapid change  based on information released by regulatory bodies including the CDC and federal and state organizations. These policies and algorithms were followed during the patient's care in the ED.  Some ED evaluations and interventions may be delayed as a result of limited staffing during the pandemic.*     Medical Decision Making:  45 yo m with history of obesity status post bypass, history of DVT and PE, remote history of colon cancer, here with left lower extremity pain, atraumatic.  Imaging reviewed, is positive for DVT of left posterior tibial veins.  He has a normal hemoglobin and platelet count.  CMP is unremarkable with normal renal function.  This appears to be an unprovoked DVT, and although  he did have an injury several months ago, he has had an ultrasound since the injury which was negative.  Denies any other specific triggers.  I reviewed his previous records from [place], including his prior hematology notes, which note that he was on Lovenox.  I called and discussed with Dr. Quita Skye of hematology at [facility] from his old clinic, who recommends starting him on Lovenox 1 meg per cake daily.  They will see the patient as an outpatient.  This was discussed with the patient and he was given his first dose here.  Otherwise, will give brief course of analgesia, advised elevation and compression, and good return precautions.  Patient understands the need to continue this indefinitely until otherwise told so by hematology.  No chest pain, shortness of breath, tachycardia, tachypnea, or signs to suggest PE.  ____________________________________________  FINAL CLINICAL IMPRESSION(S) / ED DIAGNOSES  Final diagnoses:  Acute deep vein thrombosis (DVT) of tibial vein of left lower extremity (HCC)     MEDICATIONS GIVEN DURING THIS VISIT:  Medications  enoxaparin (LOVENOX) 100 mg/mL injection 170 mg (has no administration in time range)  oxyCODONE-acetaminophen (PERCOCET/ROXICET) 5-325 MG per tablet 1 tablet (1 tablet  Oral Given 05/03/21 1218)  ondansetron (ZOFRAN-ODT) disintegrating tablet 4 mg (4 mg Oral Given 05/03/21 1218)     ED Discharge Orders          Ordered    oxyCODONE-acetaminophen (PERCOCET) 5-325 MG tablet  Every 6 hours PRN        05/03/21 1224    enoxaparin (LOVENOX) 120 MG/0.8ML injection  Every 24 hours        05/03/21 1224             Note:  This document was prepared using Dragon voice recognition software and may include unintentional dictation errors.   Duffy Bruce, MD 05/03/21 1225

## 2021-05-03 NOTE — ED Triage Notes (Signed)
Pt reports that he has been having left leg pain for the past 3 days, unknown injury, states that he did work out that morning and has been walking a lot more states hx of blood clot in the past so urgent care sent him over to be evaluated for possible blood clot

## 2021-05-03 NOTE — ED Notes (Signed)
US at bedside

## 2021-05-03 NOTE — Discharge Instructions (Addendum)
Dr. Quita Skye actually called back and requested that we do TWICE a day for now, for at least the first 2 months. I've called the pharmacy to change this.   Your dose will be 150 mg twice a day. You can start this tomorrow morning as you've been given a dose today.  Call [facility] for follow-up in the next few weeks  I've called in 3 months of medication  Please call their clinic if any issues with the pharmacy

## 2021-05-03 NOTE — ED Notes (Signed)
ED provider currently arranging prescriptions for pt discharge. Insurance issue.

## 2021-05-03 NOTE — ED Notes (Signed)
Messaged pharmacy to send missing lovenox to give pt for DVT before discharged.
